# Patient Record
Sex: Female | Born: 1985 | Race: White | Hispanic: No | Marital: Single | Smoking: Never smoker
Health system: Southern US, Community
[De-identification: ages and names within clinical notes are randomized; demographics above are authoritative.]

## PROBLEM LIST (undated history)

## (undated) DIAGNOSIS — I471 Supraventricular tachycardia, unspecified: Secondary | ICD-10-CM

---

## 2020-09-28 ENCOUNTER — Ambulatory Visit
Admission: EM | Admit: 2020-09-28 | Discharge: 2020-09-28 | Disposition: A | Discharge status: Home / Self Care | Payer: 59 | Attending: Physician Assistant | Admitting: Physician Assistant

## 2020-09-28 ENCOUNTER — Other Ambulatory Visit: Payer: Self-pay

## 2020-09-28 ENCOUNTER — Encounter: Payer: Self-pay | Admitting: Emergency Medicine

## 2020-09-28 DIAGNOSIS — R55 Syncope and collapse: Secondary | ICD-10-CM

## 2020-09-28 LAB — POCT FASTING CBG KUC MANUAL ENTRY: POCT Glucose (KUC): 98 mg/dL (ref 70–99)

## 2020-09-28 NOTE — ED Triage Notes (Signed)
States she had an episode on the way to work feeling like she was going to pass out.  States her heart was racing at that time.  States she feels tired and nauseated.  Also c/o right ear pain.

## 2020-09-29 NOTE — ED Provider Notes (Signed)
[facility]    CSN: [id number] Arrival date & time: 09/28/20  1018      History   Chief Complaint No chief complaint on file.   HPI Jacqueline Warner is a 35 y.o. female.   Pt reports she was driving to work and felt like she was going to pass out.  Pt has a history of svt and has had cardiac evaluations for the same in the past.  Pt reports this felt different.  Pt has had a holter monitor  and echo in the past.  The history is provided by the patient. No language interpreter was used.  Weakness Severity:  Moderate Onset quality:  Sudden Timing:  Constant Progression:  Resolved Chronicity:  Recurrent Relieved by:  Nothing Worsened by:  Nothing Ineffective treatments:  None tried Associated symptoms: no chest pain and no shortness of breath   Risk factors: no anemia     History reviewed. No pertinent past medical history.  There are no problems to display for this patient.   History reviewed. No pertinent surgical history.  OB History   No obstetric history on file.      Home Medications    Prior to Admission medications   Not on File    Family History History reviewed. No pertinent family history.  Social History Social History   Tobacco Use  . Smoking status: Never Smoker  . Smokeless tobacco: Never Used  Vaping Use  . Vaping Use: Never used  Substance Use Topics  . Alcohol use: Yes    Comment: occassionally  . Drug use: Never     Allergies   Levaquin [levofloxacin]   Review of Systems Review of Systems  Respiratory: Negative for shortness of breath.   Cardiovascular: Negative for chest pain.  Neurological: Positive for weakness.  All other systems reviewed and are negative.    Physical Exam Triage Vital Signs ED Triage Vitals  Enc Vitals Group     BP 09/28/20 1049 130/76     Pulse Rate 09/28/20 1049 (!) 51     Resp 09/28/20 1049 18     Temp 09/28/20 1049 98.2 F (36.8 C)     Temp Source 09/28/20 1049 Oral     SpO2  09/28/20 1049 98 %     Weight --      Height --      Head Circumference --      Peak Flow --      Pain Score 09/28/20 1051 0     Pain Loc --      Pain Edu? --      Excl. in GC? --    No data found.  Updated Vital Signs BP 130/76 (BP Location: Right Arm)   Pulse (!) 51   Temp 98.2 F (36.8 C) (Oral)   Resp 18   LMP 09/12/2020   SpO2 98%   Visual Acuity Right Eye Distance:   Left Eye Distance:   Bilateral Distance:    Right Eye Near:   Left Eye Near:    Bilateral Near:     Physical Exam Vitals and nursing note reviewed.  Constitutional:      Appearance: She is well-developed.  HENT:     Head: Normocephalic.     Right Ear: Tympanic membrane normal.     Mouth/Throat:     Mouth: Mucous membranes are moist.  Eyes:     Extraocular Movements: Extraocular movements intact.     Pupils: Pupils are equal, round, and reactive to  light.  Cardiovascular:     Rate and Rhythm: Normal rate.  Pulmonary:     Effort: Pulmonary effort is normal.  Abdominal:     General: There is no distension.  Musculoskeletal:        General: Normal range of motion.     Cervical back: Normal range of motion.  Skin:    General: Skin is warm.  Neurological:     General: No focal deficit present.     Mental Status: She is alert and oriented to person, place, and time.  Psychiatric:        Mood and Affect: Mood normal.      UC Treatments / Results  Labs (all labs ordered are listed, but only abnormal results are displayed) Labs Reviewed  POCT FASTING CBG [facility] MANUAL ENTRY    EKG   Radiology No results found.  Procedures Procedures (including critical care time)  Medications Ordered in UC Medications - No data to display  Initial Impression / Assessment and Plan / UC Course  I have reviewed the triage vital signs and the nursing notes.  Pertinent labs & imaging results that were available during my care of the patient were reviewed by me and considered in my medical decision  making (see chart for details).    MDM:  EKG no acute.  Pt in a normal rhythm.  Pt did not eat this am.  Pt drinks energy drinks.    Pt declined going to ED for testing.  I counseled on limitations of Urgent care.  Pt is given referral to cardiology for followup  Final Clinical Impressions(s) / UC Diagnoses   Final diagnoses:  Near syncope   Discharge Instructions   None    ED Prescriptions    None     PDMP not reviewed this encounter.  An After Visit Summary was printed and given to the patient.    Elson Areas, [place] 09/29/20 [phone]

## 2021-02-27 ENCOUNTER — Other Ambulatory Visit: Payer: Self-pay

## 2021-02-27 ENCOUNTER — Ambulatory Visit (INDEPENDENT_AMBULATORY_CARE_PROVIDER_SITE_OTHER): Payer: Self-pay

## 2021-02-27 ENCOUNTER — Ambulatory Visit
Admission: EM | Admit: 2021-02-27 | Discharge: 2021-02-27 | Disposition: A | Discharge status: Home / Self Care | Payer: 59 | Attending: Internal Medicine | Admitting: Internal Medicine

## 2021-02-27 DIAGNOSIS — R197 Diarrhea, unspecified: Secondary | ICD-10-CM

## 2021-02-27 DIAGNOSIS — R1030 Lower abdominal pain, unspecified: Secondary | ICD-10-CM

## 2021-02-27 DIAGNOSIS — A09 Infectious gastroenteritis and colitis, unspecified: Secondary | ICD-10-CM

## 2021-02-27 LAB — POCT URINE PREGNANCY: Preg Test, Ur: NEGATIVE

## 2021-02-27 MED ORDER — AMOXICILLIN-POT CLAVULANATE 875-125 MG PO TABS: 1.0000 | ORAL_TABLET | Freq: Two times a day (BID) | ORAL | 0 refills | Status: AC

## 2021-02-27 MED ORDER — POLYETHYLENE GLYCOL 3350 17 G PO PACK: 17.0000 g | PACK | Freq: Every day | ORAL | 0 refills | Status: AC | PRN

## 2021-02-27 NOTE — ED Provider Notes (Signed)
[facility]    CSN: [id number] Arrival date & time: 02/27/21  0846      History   Chief Complaint Chief Complaint  Patient presents with   Abdominal Pain   Nausea    HPI Jacqueline Warner is a 35 y.o. female comes to the urgent care with 4-day history of lower abdominal pain, nausea and bloating sensation.  Patient initially felt constipated and has tried over-the-counter agents with no improvement in the bloating sensation.  She had some diarrheal bowel movements.  She still feels constipated and bloated.  No febrile episodes.  No generalized body aches.  No fever or chills.  He denies any vomiting.Marland Kitchen   HPI  History reviewed. No pertinent past medical history.  There are no problems to display for this patient.   History reviewed. No pertinent surgical history.  OB History   No obstetric history on file.      Home Medications    Prior to Admission medications   Medication Sig Start Date End Date Taking? Authorizing Provider  amoxicillin-clavulanate (AUGMENTIN) 875-125 MG tablet Take 1 tablet by mouth every 12 (twelve) hours. 02/27/21  Yes Thiago Ragsdale, Britta Mccreedy, MD  polyethylene glycol (MIRALAX) 17 g packet Take 17 g by mouth daily as needed. 02/27/21  Yes Cayman Kielbasa, Britta Mccreedy, MD    Family History History reviewed. No pertinent family history.  Social History Social History   Tobacco Use   Smoking status: Never   Smokeless tobacco: Never  Vaping Use   Vaping Use: Never used  Substance Use Topics   Alcohol use: Yes    Comment: occassionally   Drug use: Never     Allergies   Levaquin [levofloxacin]   Review of Systems Review of Systems  Constitutional: Negative.   Gastrointestinal:  Positive for abdominal pain, constipation, diarrhea and nausea. Negative for vomiting.  Musculoskeletal: Negative.   Skin: Negative.     Physical Exam Triage Vital Signs ED Triage Vitals  Enc Vitals Group     BP 02/27/21 1001 122/77     Pulse Rate 02/27/21 1001 62      Resp 02/27/21 1001 18     Temp 02/27/21 1001 98.1 F (36.7 C)     Temp Source 02/27/21 1001 Oral     SpO2 02/27/21 1001 99 %     Weight --      Height --      Head Circumference --      Peak Flow --      Pain Score 02/27/21 1003 4     Pain Loc --      Pain Edu? --      Excl. in GC? --    No data found.  Updated Vital Signs BP 122/77 (BP Location: Right Arm)   Pulse 62   Temp 98.1 F (36.7 C) (Oral)   Resp 18   LMP 02/02/2021   SpO2 99%   Visual Acuity Right Eye Distance:   Left Eye Distance:   Bilateral Distance:    Right Eye Near:   Left Eye Near:    Bilateral Near:     Physical Exam Vitals and nursing note reviewed.  Constitutional:      General: She is not in acute distress.    Appearance: She is not ill-appearing.  Cardiovascular:     Rate and Rhythm: Normal rate and regular rhythm.  Pulmonary:     Effort: Pulmonary effort is normal.     Breath sounds: Normal breath sounds.  Abdominal:  Palpations: Abdomen is soft. There is no shifting dullness or splenomegaly.     Tenderness: There is no abdominal tenderness. There is no guarding or rebound.  Neurological:     Mental Status: She is alert.     UC Treatments / Results  Labs (all labs ordered are listed, but only abnormal results are displayed) Labs Reviewed  CBC WITH DIFFERENTIAL/PLATELET  POCT URINE PREGNANCY    EKG   Radiology DG Abd 1 View  Result Date: 02/27/2021 CLINICAL DATA:  Lower abdominal pain and nausea for 4 days. Diarrhea. EXAM: ABDOMEN - 1 VIEW COMPARISON:  None. FINDINGS: The bowel gas pattern is normal. No radio-opaque calculi or other significant radiographic abnormality are seen. IMPRESSION: Negative. Electronically Signed   By: Danae Orleans M.D.   On: 02/27/2021 11:20    Procedures Procedures (including critical care time)  Medications Ordered in UC Medications - No data to display  Initial Impression / Assessment and Plan / UC Course  I have reviewed the triage  vital signs and the nursing notes.  Pertinent labs & imaging results that were available during my care of the patient were reviewed by me and considered in my medical decision making (see chart for details).     1.  Colitis: KUB is negative for bowel distention or obstruction Augmentin 875-125 mg twice daily for 7 days MiraLAX as needed for constipation Return precautions given CBC. Final Clinical Impressions(s) / UC Diagnoses   Final diagnoses:  Infectious colitis     Discharge Instructions      Increase oral fluid intake Please take medications as prescribed We will call you with recommendations if labs are abnormal Return to urgent care if symptoms are persistent or worsens.   ED Prescriptions     Medication Sig Dispense Auth. Provider   amoxicillin-clavulanate (AUGMENTIN) 875-125 MG tablet Take 1 tablet by mouth every 12 (twelve) hours. 14 tablet Nea Gittens, Britta Mccreedy, MD   polyethylene glycol (MIRALAX) 17 g packet Take 17 g by mouth daily as needed. 14 each Jeanet Lupe, Britta Mccreedy, MD      PDMP not reviewed this encounter.   Merrilee Jansky, MD 02/27/21 [phone]

## 2021-02-27 NOTE — Discharge Instructions (Addendum)
Increase oral fluid intake Please take medications as prescribed We will call you with recommendations if labs are abnormal Return to urgent care if symptoms are persistent or worsens.

## 2021-02-27 NOTE — ED Triage Notes (Signed)
Pt present lower abdominal pain with nausea. Symptom started four days ago. Pt has been having some diarrhea that is watery and gas on her stomach. Pt tried otc medication to help with some relief but then she becomes constipated again

## 2021-02-28 LAB — CBC WITH DIFFERENTIAL/PLATELET
Basophils Absolute: 0 10*3/uL (ref 0.0–0.2)
Basos: 1 %
EOS (ABSOLUTE): 0.1 10*3/uL (ref 0.0–0.4)
Eos: 1 %
Hematocrit: 40.1 % (ref 34.0–46.6)
Hemoglobin: 13.3 g/dL (ref 11.1–15.9)
Immature Grans (Abs): 0 10*3/uL (ref 0.0–0.1)
Immature Granulocytes: 0 %
Lymphocytes Absolute: 1.5 10*3/uL (ref 0.7–3.1)
Lymphs: 31 %
MCH: 30.3 pg (ref 26.6–33.0)
MCHC: 33.2 g/dL (ref 31.5–35.7)
MCV: 91 fL (ref 79–97)
Monocytes Absolute: 0.4 10*3/uL (ref 0.1–0.9)
Monocytes: 9 %
Neutrophils Absolute: 2.7 10*3/uL (ref 1.4–7.0)
Neutrophils: 58 %
Platelets: 158 10*3/uL (ref 150–450)
RBC: 4.39 x10E6/uL (ref 3.77–5.28)
RDW: 12.9 % (ref 11.7–15.4)
WBC: 4.7 10*3/uL (ref 3.4–10.8)

## 2021-11-16 IMAGING — DX DG ABDOMEN 1V
1 series · 1 of 1 positions shown · non-contrast
Comparison: None.

CLINICAL DATA: Lower abdominal pain and nausea for 4 days.
Diarrhea.

EXAM:
ABDOMEN - 1 VIEW

[abdomen supine ap]
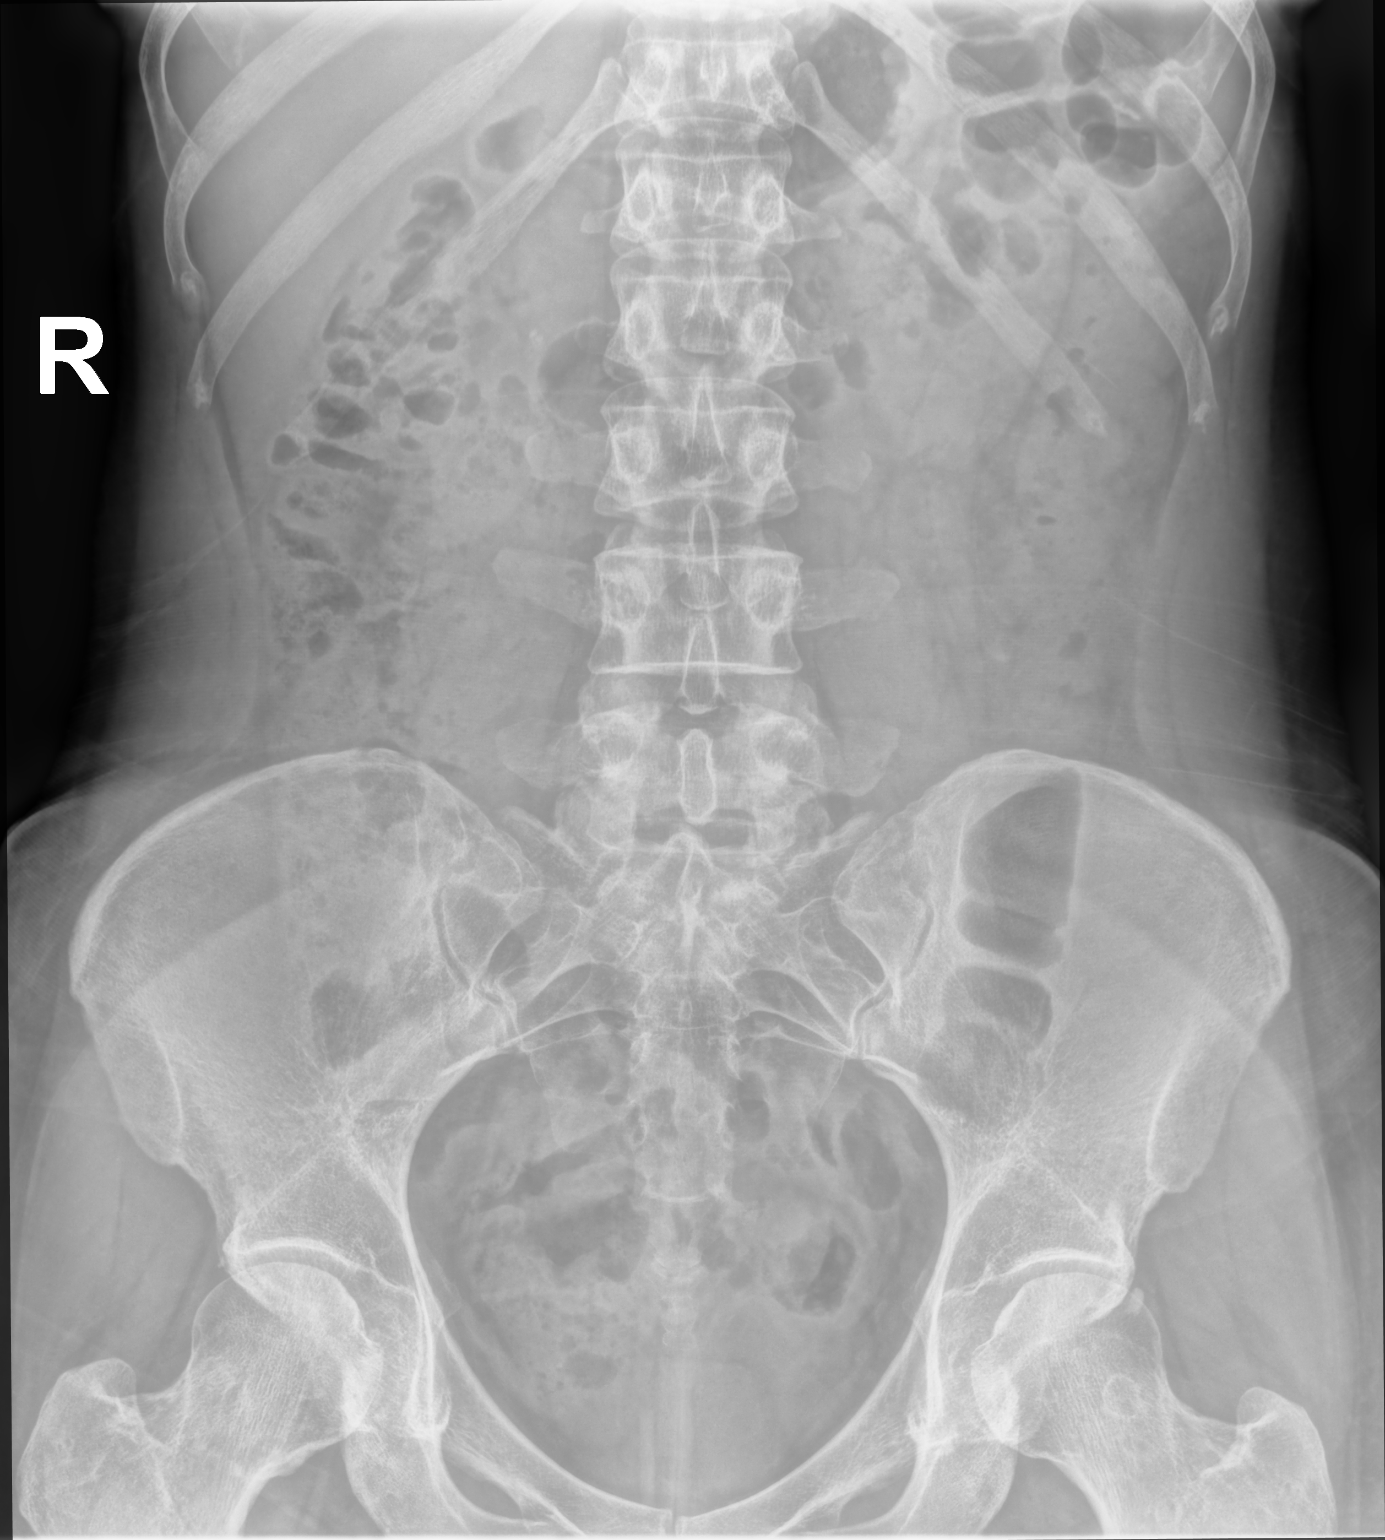

[1 of 1 positions shown; findings below may reference images not displayed]

FINDINGS: The bowel gas pattern is normal. No radio-opaque calculi or other
significant radiographic abnormality are seen.
IMPRESSION: Negative.

## 2021-12-23 ENCOUNTER — Emergency Department (HOSPITAL_COMMUNITY)
Admission: EM | Admit: 2021-12-23 | Discharge: 2021-12-24 | Disposition: A | Discharge status: Home / Self Care | Payer: 59 | Attending: Emergency Medicine | Admitting: Emergency Medicine

## 2021-12-23 ENCOUNTER — Emergency Department (HOSPITAL_COMMUNITY): Payer: 59

## 2021-12-23 ENCOUNTER — Encounter (HOSPITAL_COMMUNITY): Payer: Self-pay | Admitting: *Deleted

## 2021-12-23 ENCOUNTER — Other Ambulatory Visit: Payer: Self-pay

## 2021-12-23 DIAGNOSIS — N83201 Unspecified ovarian cyst, right side: Secondary | ICD-10-CM | POA: Insufficient documentation

## 2021-12-23 DIAGNOSIS — N83299 Other ovarian cyst, unspecified side: Secondary | ICD-10-CM

## 2021-12-23 DIAGNOSIS — R109 Unspecified abdominal pain: Secondary | ICD-10-CM

## 2021-12-23 DIAGNOSIS — R1031 Right lower quadrant pain: Secondary | ICD-10-CM | POA: Diagnosis present

## 2021-12-23 DIAGNOSIS — N83209 Unspecified ovarian cyst, unspecified side: Secondary | ICD-10-CM | POA: Insufficient documentation

## 2021-12-23 HISTORY — DX: Supraventricular tachycardia: I47.1

## 2021-12-23 HISTORY — DX: Supraventricular tachycardia, unspecified: I47.10

## 2021-12-23 LAB — COMPREHENSIVE METABOLIC PANEL
ALT: 20 U/L (ref 0–44)
AST: 19 U/L (ref 15–41)
Albumin: 4.2 g/dL (ref 3.5–5.0)
Alkaline Phosphatase: 49 U/L (ref 38–126)
Anion gap: 6 (ref 5–15)
BUN: 14 mg/dL (ref 6–20)
CO2: 24 mmol/L (ref 22–32)
Calcium: 9.8 mg/dL (ref 8.9–10.3)
Chloride: 106 mmol/L (ref 98–111)
Creatinine, Ser: 0.71 mg/dL (ref 0.44–1.00)
GFR, Estimated: >60 mL/min (ref >60)
Glucose, Bld: 100 mg/dL — ABNORMAL HIGH (ref 70–99)
Potassium: 4.4 mmol/L (ref 3.5–5.1)
Sodium: 136 mmol/L (ref 135–145)
Total Bilirubin: 0.5 mg/dL (ref 0.3–1.2)
Total Protein: 7 g/dL (ref 6.5–8.1)

## 2021-12-23 LAB — CBC WITH DIFFERENTIAL/PLATELET
Abs Immature Granulocytes: 0.01 10*3/uL (ref 0.00–0.07)
Basophils Absolute: 0 10*3/uL (ref 0.0–0.1)
Basophils Relative: 0 %
Eosinophils Absolute: 0.1 10*3/uL (ref 0.0–0.5)
Eosinophils Relative: 1 %
HCT: 39.2 % (ref 36.0–46.0)
Hemoglobin: 13.2 g/dL (ref 12.0–15.0)
Immature Granulocytes: 0 %
Lymphocytes Relative: 25 %
Lymphs Abs: 1.7 10*3/uL (ref 0.7–4.0)
MCH: 30.1 pg (ref 26.0–34.0)
MCHC: 33.7 g/dL (ref 30.0–36.0)
MCV: 89.5 fL (ref 80.0–100.0)
Monocytes Absolute: 0.5 10*3/uL (ref 0.1–1.0)
Monocytes Relative: 8 %
Neutro Abs: 4.4 10*3/uL (ref 1.7–7.7)
Neutrophils Relative %: 66 %
Platelets: 173 10*3/uL (ref 150–400)
RBC: 4.38 MIL/uL (ref 3.87–5.11)
RDW: 12.7 % (ref 11.5–15.5)
WBC: 6.8 10*3/uL (ref 4.0–10.5)
nRBC: 0 % (ref 0.0–0.2)

## 2021-12-23 LAB — URINALYSIS, ROUTINE W REFLEX MICROSCOPIC
Bilirubin Urine: NEGATIVE
Glucose, UA: NEGATIVE mg/dL
Hgb urine dipstick: NEGATIVE
Ketones, ur: 20 mg/dL — AB
Leukocytes,Ua: NEGATIVE
Nitrite: NEGATIVE
Protein, ur: NEGATIVE mg/dL
Specific Gravity, Urine: 1.021 (ref 1.005–1.030)
pH: 7 (ref 5.0–8.0)

## 2021-12-23 LAB — LIPASE, BLOOD: Lipase: 36 U/L (ref 11–51)

## 2021-12-23 LAB — PREGNANCY, URINE: Preg Test, Ur: NEGATIVE

## 2021-12-23 MED ORDER — MORPHINE SULFATE (PF) 4 MG/ML IV SOLN
2.0000 mg | Freq: Once | INTRAVENOUS | Status: AC
  Administered 2021-12-24: 2 mg via INTRAVENOUS
  Filled 2021-12-23: qty 1

## 2021-12-23 MED ORDER — IOHEXOL 300 MG/ML  SOLN
100.0000 mL | Freq: Once | INTRAMUSCULAR | Status: AC | PRN
  Administered 2021-12-23: 100 mL via INTRAVENOUS

## 2021-12-23 MED ORDER — OXYCODONE HCL 5 MG PO TABS: 5.0000 mg | ORAL_TABLET | Freq: Four times a day (QID) | ORAL | 0 refills | Status: AC | PRN

## 2021-12-23 MED ORDER — IBUPROFEN 800 MG PO TABS: 800.0000 mg | ORAL_TABLET | Freq: Three times a day (TID) | ORAL | 0 refills | Status: AC

## 2021-12-23 MED ORDER — KETOROLAC TROMETHAMINE 30 MG/ML IJ SOLN
30.0000 mg | Freq: Once | INTRAMUSCULAR | Status: AC
Start: 2021-12-23 — End: 2021-12-23
  Administered 2021-12-23: 30 mg via INTRAVENOUS
  Filled 2021-12-23: qty 1

## 2021-12-23 MED ORDER — MORPHINE SULFATE (PF) 4 MG/ML IV SOLN
4.0000 mg | Freq: Once | INTRAVENOUS | Status: AC
  Administered 2021-12-23: 4 mg via INTRAVENOUS
  Filled 2021-12-23: qty 1

## 2021-12-23 NOTE — ED Provider Notes (Signed)
[facility] EMERGENCY DEPARTMENT Provider Note   CSN: [id number] Arrival date & time: 12/23/21  2113     History  Chief Complaint  Patient presents with   Abdominal Pain    Jacqueline Warner is a 36 y.o. female presenting to the abdominal pain.  She reports onset earlier this evening with initially left lower side abdominal pressure, had a bowel movement with relief, but the pain is now migrated to the right lower side and is persistent.  It is moderate intensity.  She has not had the symptoms before.  She reports she does have a history of an ovarian cyst, is not currently on her menstrual cycle.  She denies history of abdominal surgeries.  She denies nausea or vomiting.  HPI     Home Medications Prior to Admission medications   Medication Sig Start Date End Date Taking? Authorizing Provider  ibuprofen (ADVIL) 800 MG tablet Take 1 tablet (800 mg total) by mouth 3 (three) times daily for 30 doses. 12/23/21 01/02/22 Yes Tenzin Edelman, Kermit Balo, MD  oxyCODONE (ROXICODONE) 5 MG immediate release tablet Take 1 tablet (5 mg total) by mouth every 6 (six) hours as needed for up to 15 doses for severe pain. 12/23/21  Yes Terald Sleeper, MD  amoxicillin-clavulanate (AUGMENTIN) 875-125 MG tablet Take 1 tablet by mouth every 12 (twelve) hours. 02/27/21   Merrilee Jansky, MD  polyethylene glycol (MIRALAX) 17 g packet Take 17 g by mouth daily as needed. 02/27/21   LampteyBritta Mccreedy, MD      Allergies    Levaquin [levofloxacin]    Review of Systems   Review of Systems  Physical Exam Updated Vital Signs BP 107/71   Pulse (!) 52   Temp 98.2 F (36.8 C) (Oral)   Resp 17   Ht 5' 3.5" (1.613 m)   Wt 70.3 kg   LMP 12/05/2021   SpO2 97%   BMI 27.03 kg/m  Physical Exam Constitutional:      General: She is not in acute distress. HENT:     Head: Normocephalic and atraumatic.  Eyes:     Conjunctiva/sclera: Conjunctivae normal.     Pupils: Pupils are equal, round, and reactive to light.   Cardiovascular:     Rate and Rhythm: Normal rate and regular rhythm.  Pulmonary:     Effort: Pulmonary effort is normal. No respiratory distress.  Abdominal:     General: There is no distension.     Tenderness: There is abdominal tenderness in the right lower quadrant and suprapubic area. There is guarding. Positive signs include McBurney's sign. Negative signs include Murphy's sign.  Skin:    General: Skin is warm and dry.  Neurological:     General: No focal deficit present.     Mental Status: She is alert. Mental status is at baseline.  Psychiatric:        Mood and Affect: Mood normal.        Behavior: Behavior normal.     ED Results / Procedures / Treatments   Labs (all labs ordered are listed, but only abnormal results are displayed) Labs Reviewed  COMPREHENSIVE METABOLIC PANEL - Abnormal; Notable for the following components:      Result Value   Glucose, Bld 100 (*)    All other components within normal limits  URINALYSIS, ROUTINE W REFLEX MICROSCOPIC - Abnormal; Notable for the following components:   APPearance HAZY (*)    Ketones, ur 20 (*)    All other components within normal  limits  CBC WITH DIFFERENTIAL/PLATELET  LIPASE, BLOOD  PREGNANCY, URINE    EKG None  Radiology US PELVIS TRANSVAGINAL NON-OB (TV ONLY)  Result Date: 12/24/2021 CLINICAL DATA:  RIGHT ovarian cyst seen on CT. EXAM: TRANSABDOMINAL AND TRANSVAGINAL ULTRASOUND OF PELVIS TECHNIQUE: Both transabdominal and transvaginal ultrasound examinations of the pelvis were performed. Transabdominal technique was performed for global imaging of the pelvis including uterus, ovaries, adnexal regions, and pelvic cul-de-sac. It was necessary to proceed with endovaginal exam following the transabdominal exam to visualize the endometrium and RIGHT and LEFT adnexa. COMPARISON:  CT evaluation which was acquired on December 23, 2021. FINDINGS: Uterus Measurements: 5.9 x 3.9 x 5.2 cm = volume: 62 mL. Uterus displays in  arcuate type configuration. No focal uterine abnormality. Endometrium Thickness: 12.7 mm.  No focal abnormality visualized. Right ovary Measurements: 5.2 x 3.4 x 4.0 cm = volume: 36.9 mL. Multi cystic area or single cysts with septation within the RIGHT ovary, echogenic area next to a more hypoechoic or anechoic area both with "cranial ated" appearance. The area that is more echogenic measuring 3.0 x 1.6 cm the adjacent area is slightly smaller measuring 2.4 x 1.5 cm in the sagittal plane with transverse dimension each area proximally 3 and 2.3 cm. Left ovary Measurements: 4.1 x 1.5 x 2.8 cm = volume: 9.2 mL. Small hemorrhagic cyst in the LEFT ovary measuring 1.7 x 1.3 x 1.7 cm Other findings Trace free fluid in the pelvis IMPRESSION: Cystic changes in the RIGHT ovary favored to represent an involuting hemorrhagic and follicular cyst of the RIGHT ovary adjacent to 1 another perhaps 1 representing hemorrhagic corpus luteum. Given that this may not represent an incidental finding based on patient history would consider short interval follow-up in 8-12 weeks to ensure resolution or earlier based on patient's symptoms. Second cystic area in the RIGHT adnexa referenced on previous imaging is not evaluated. Study not performed as a spectral Doppler assessment but there is gross evidence of color Doppler flow. If there is concern for torsion based on patient history could consider additional evaluation as warranted. Electronically Signed   By: Donzetta Kohut M.D.   On: 12/24/2021 12:20   CT ABDOMEN PELVIS W CONTRAST  Result Date: 12/23/2021 CLINICAL DATA:  Right lower quadrant abdominal pain. EXAM: CT ABDOMEN AND PELVIS WITH CONTRAST TECHNIQUE: Multidetector CT imaging of the abdomen and pelvis was performed using the standard protocol following bolus administration of intravenous contrast. RADIATION DOSE REDUCTION: This exam was performed according to the departmental dose-optimization program which includes automated  exposure control, adjustment of the mA and/or kV according to patient size and/or use of iterative reconstruction technique. CONTRAST:  OMNIPAQUE IOHEXOL 300 MG/ML  SOLN COMPARISON:  None Available. FINDINGS: Lower chest: The visualized lung bases are clear. No intra-abdominal free air.  Small free fluid within the pelvis. Hepatobiliary: No focal liver abnormality is seen. No gallstones, gallbladder wall thickening, or biliary dilatation. Pancreas: Unremarkable. No pancreatic ductal dilatation or surrounding inflammatory changes. Spleen: Normal in size without focal abnormality. Adrenals/Urinary Tract: The adrenal glands are unremarkable right renal cortical scarring. There is no hydronephrosis on either side. There is symmetric enhancement of the kidneys. The visualized ureters and urinary bladder appear unremarkable. Stomach/Bowel: There is no bowel obstruction or active inflammation. The appendix is not identified. The cecum abuts the right adnexa. There is a 1.2 x 1.2 cm loculated collection or cystic structure in the region of the right adnexa adjacent to posteromedial cecum. Vascular/Lymphatic: The abdominal aorta and IVC  are unremarkable. No portal venous gas. There is no adenopathy. Reproductive: The uterus is anteverted. There is a by coronoid or arcuate uterine morphology. Complex multi cystic or multi-septated right ovarian lesion measuring 4.2 x 3.1 cm. Further evaluation with pelvic ultrasound recommended. Other: None Musculoskeletal: No acute or significant osseous findings. IMPRESSION: 1. Complex multi cystic or multi-septated right ovarian lesion. Further evaluation with pelvic ultrasound recommended. 2. Nonvisualization of the appendix. A small fluid collection in the region of the adnexa adjacent to the posterior cecum is indeterminate. Attention on pelvic ultrasound recommended. 3. No bowel obstruction. Electronically Signed   By: Elgie Collard M.D.   On: 12/23/2021 23:18     Procedures Procedures    Medications Ordered in ED Medications  morphine (PF) 4 MG/ML injection 4 mg (4 mg Intravenous Given 12/23/21 2215)  ketorolac (TORADOL) 30 MG/ML injection 30 mg (30 mg Intravenous Given 12/23/21 2215)  iohexol (OMNIPAQUE) 300 MG/ML solution 100 mL (100 mLs Intravenous Contrast Given 12/23/21 2256)  morphine (PF) 4 MG/ML injection 2 mg (2 mg Intravenous Given 12/24/21 0014)    ED Course/ Medical Decision Making/ A&P Clinical Course as of 12/24/21 1445  Thu Dec 23, 2021  2348 Reassessed the patient her pain is overall improved.  We discussed the CT findings, with a complex ovarian cyst.  I suspect likely cause of her symptoms.  She does not appear clinically to be having issues with ovarian torsion at this point.  We could not visualize the appendix on the CT scan, but with no leukocytosis, no persistent GI symptoms or nausea, and no periappendiceal inflammation, this seems less likely the case.  We discussed the options of observation stay in the hospital versus close follow-up, I do believe she needs a pelvic ultrasound, which is not available at this hour, but she will likely return in the morning for pelvic ultrasonography for her complex cyst.  She may need OB follow-up.  She does not have an OB/GYN doctor here in town, since she moved from [place]. [MT]    Clinical Course User Index [MT] Rosaura Bolon, Kermit Balo, MD                           Medical Decision Making Amount and/or Complexity of Data Reviewed Labs: ordered. Radiology: ordered.  Risk Prescription drug management.   This patient presents to the Emergency Department with complaint of abdominal pain. This involves an extensive number of treatment options, and is a complaint that carries with it a high risk of complications and morbidity.  The differential diagnosis includes, but is not limited to, appendicitis versus ruptured ovarian cyst versus ureteral colic versus UTI versus colitis versus other  I  ordered, reviewed, and interpreted labs, notable for no leukocytosis, Cr at baseline, no evidence of infection on UA I ordered medication IV morphine, IV Toradol for abdominal pain and/or nausea I ordered imaging studies which included CT abdomen pelvis  I independently visualized and interpreted imaging which showed complex right peri-ovarian cystic structure, appendix not fully visualized, and the monitor tracing which showed NSR  After the interventions stated above, I reevaluated the patient and found that they remained clinically stable, with improvement of her pain.  Given that her pain has improved, my suspicion for ovarian torsion is lower, but we discussed this on the differential.  She will return tomorrow morning for pelvic ultrasound with doppler imaging and needs OB follow up.  If pain is worsening she needs to return  to the ED.  Clinical Course as of 12/24/21 1445  Thu Dec 23, 2021  2348 Reassessed the patient her pain is overall improved.  We discussed the CT findings, with a complex ovarian cyst.  I suspect likely cause of her symptoms.  She does not appear clinically to be having issues with ovarian torsion at this point.  We could not visualize the appendix on the CT scan, but with no leukocytosis, no persistent GI symptoms or nausea, and no periappendiceal inflammation, this seems less likely the case.  We discussed the options of observation stay in the hospital versus close follow-up, I do believe she needs a pelvic ultrasound, which is not available at this hour, but she will likely return in the morning for pelvic ultrasonography for her complex cyst.  She may need OB follow-up.  She does not have an OB/GYN doctor here in town, since she moved from [place]. [MT]    Clinical Course User Index [MT] Jolea Dolle, Kermit Balo, MD            Final Clinical Impression(s) / ED Diagnoses Final diagnoses:  Abdominal pain, unspecified abdominal location  Complex ovarian cyst    Rx  / DC Orders ED Discharge Orders          Ordered    US Transvaginal Non-OB (US Pelvis)  Status:  Canceled        12/23/21 2350    oxyCODONE (ROXICODONE) 5 MG immediate release tablet  Every 6 hours PRN        12/23/21 2353    ibuprofen (ADVIL) 800 MG tablet  3 times daily        12/23/21 2353    US PELVIS TRANSVAGINAL NON-OB (TV ONLY)        12/23/21 2350              Terald Sleeper, MD 12/24/21 1447

## 2021-12-23 NOTE — ED Notes (Signed)
Patient transported to CT 

## 2021-12-23 NOTE — ED Triage Notes (Signed)
Pt with RLQ pain that started around 1830, states pain is getting worse.  LBM earlier-normal per pt.  Denies N/V/D

## 2021-12-23 NOTE — Discharge Instructions (Addendum)
Your CT scan showed a have a large, complex cyst on your right ovary, which is likely the cause of your abdominal pain.  This needs closer attention, including an ultrasound image, as well as follow-up with an OB/GYN doctor.  In the meantime you can take ibuprofen as needed at home for pain.  If your pain continues to worsen, or if you have persistent vomiting, began having fevers, please come back to the ER immediately.  We talked about returning in the morning for an ultrasound of your ovaries.  Please see the instructions below.  *  IMPORTANT PATIENT INSTRUCTIONS:  Your ED provider has recommended an Outpatient Ultrasound.  Please call [phone] tomorrow morning to schedule an appointment.  If your appointment is scheduled for a Saturday, Sunday or holiday, please go to the [facility] Emergency Department Registration Desk at least 15 minutes prior to your appointment time and tell them you are there for an ultrasound.    If your appointment is scheduled for a weekday (Monday-Friday), please go directly to the [facility] Radiology Department at least 15 minutes prior to your appointment time and tell them you are there for an ultrasound.  Please call [phone] with questions.

## 2021-12-24 ENCOUNTER — Emergency Department (HOSPITAL_COMMUNITY)
Admission: RE | Admit: 2021-12-24 | Discharge: 2021-12-24 | Disposition: A | Discharge status: Home / Self Care | Payer: 59 | Source: Ambulatory Visit | Attending: Emergency Medicine | Admitting: Emergency Medicine

## 2021-12-24 DIAGNOSIS — N83201 Unspecified ovarian cyst, right side: Secondary | ICD-10-CM

## 2022-09-11 IMAGING — CT CT ABD-PELV W/ CM
2 of 4 series · 16 of 46 positions shown, 18 images · IV contrast (Omnipaque or Isovue)
Comparison: None Available.

CLINICAL DATA: Right lower quadrant abdominal pain.

EXAM:
CT ABDOMEN AND PELVIS WITH CONTRAST
TECHNIQUE: Multidetector CT imaging of the abdomen and pelvis was performed
using the standard protocol following bolus administration of
intravenous contrast.

[Series 2: axial st · axial · 0.60mm/px · z∈[+1026,+1426]mm · 13 of 90 slices shown, 15 images]
[im 5/90  soft-tissue]
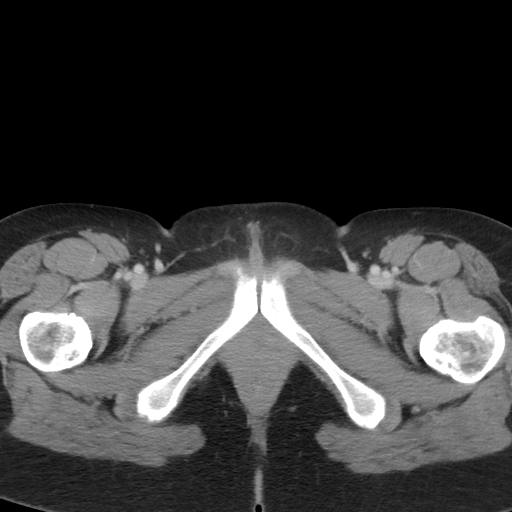
[im 5/90  bone]
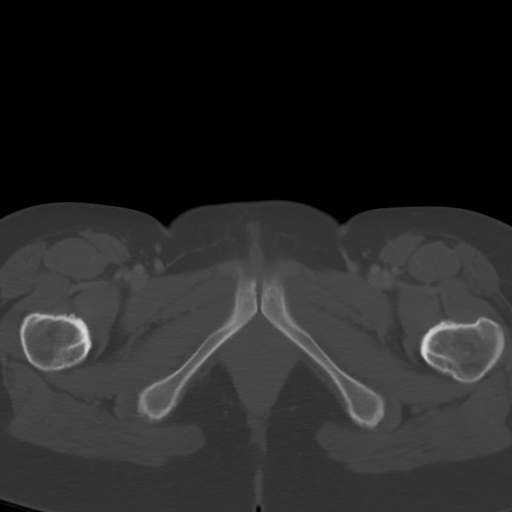
[im 13/90  soft-tissue]
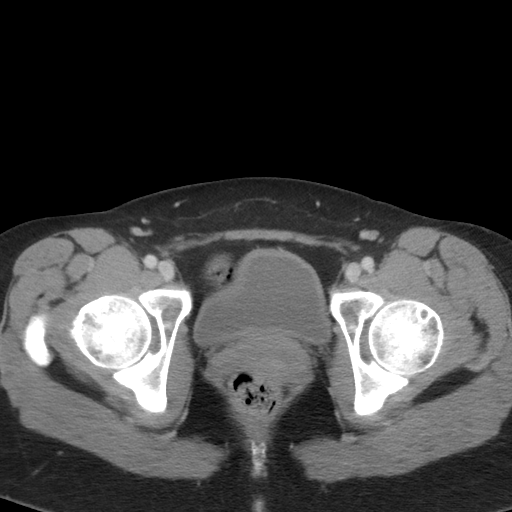
[im 21/90  soft-tissue]
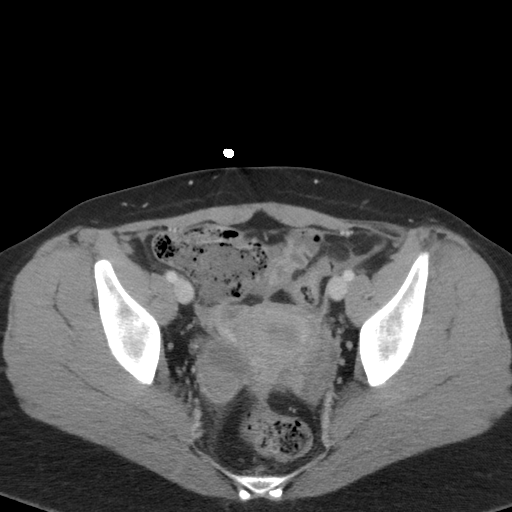
[im 25/90  soft-tissue]
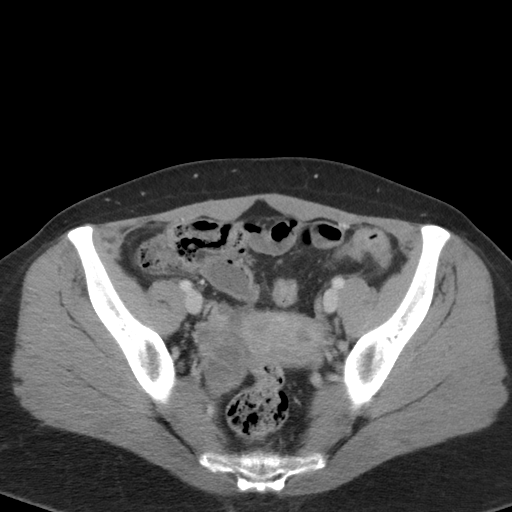
[im 33/90  soft-tissue]
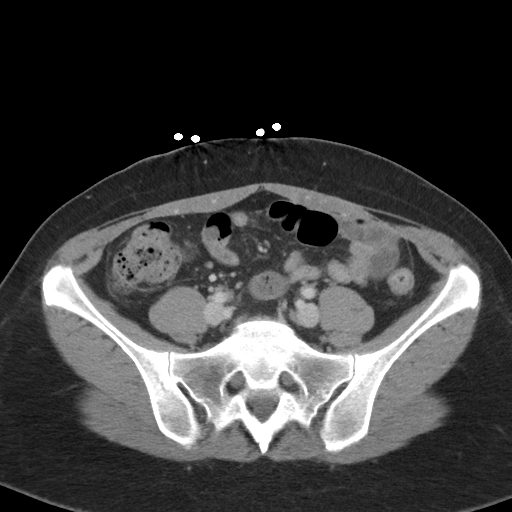
[im 37/90  soft-tissue]
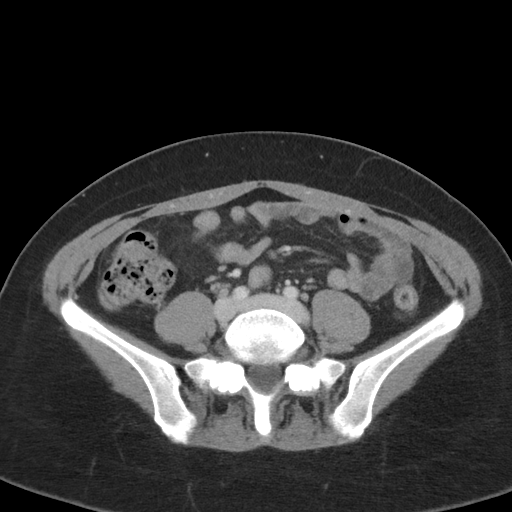
[im 45/90  soft-tissue]
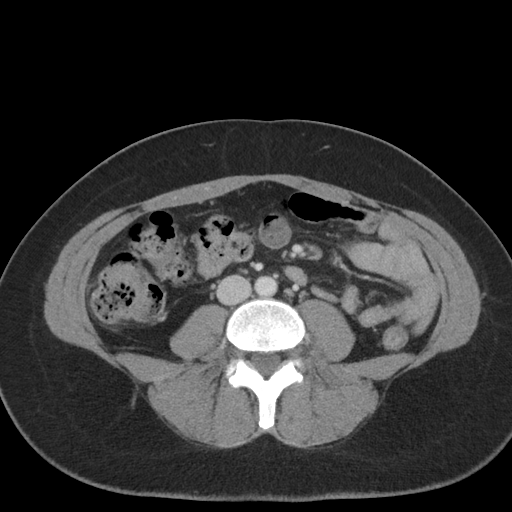
[im 53/90  soft-tissue]
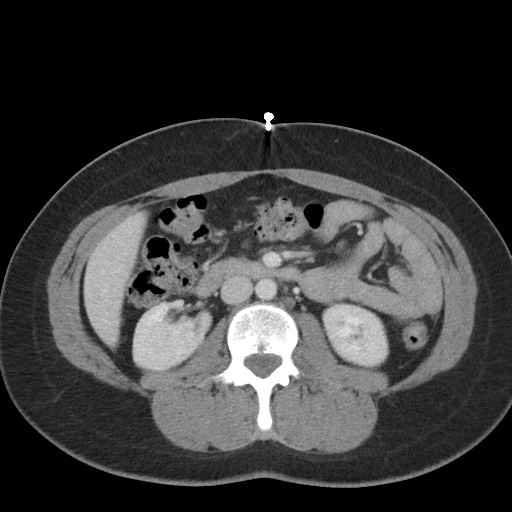
[im 57/90  soft-tissue]
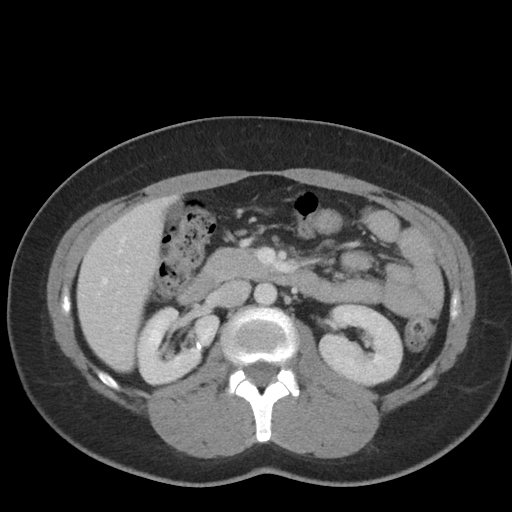
[im 57/90  bone]
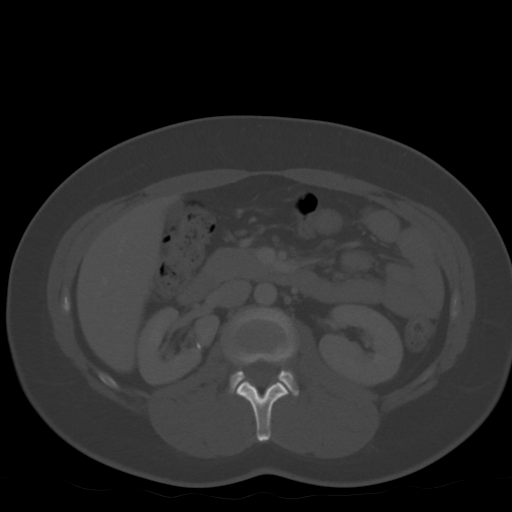
[im 65/90  soft-tissue]
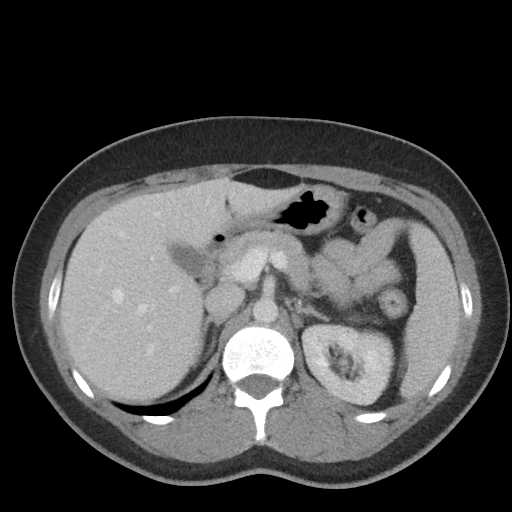
[im 69/90  soft-tissue]
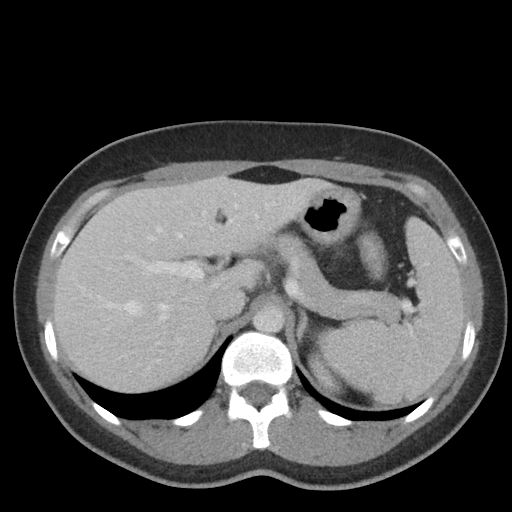
[im 77/90  soft-tissue]
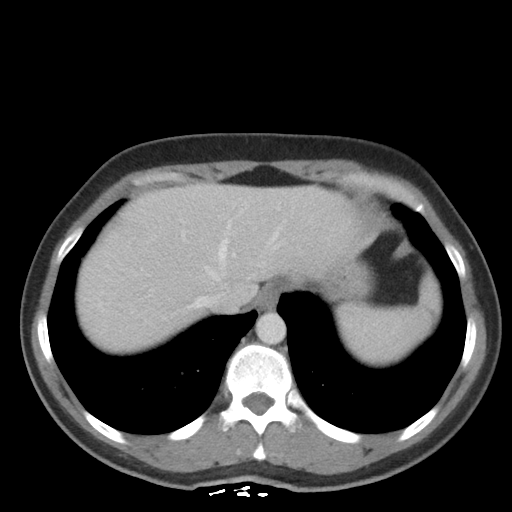
[im 85/90  soft-tissue]
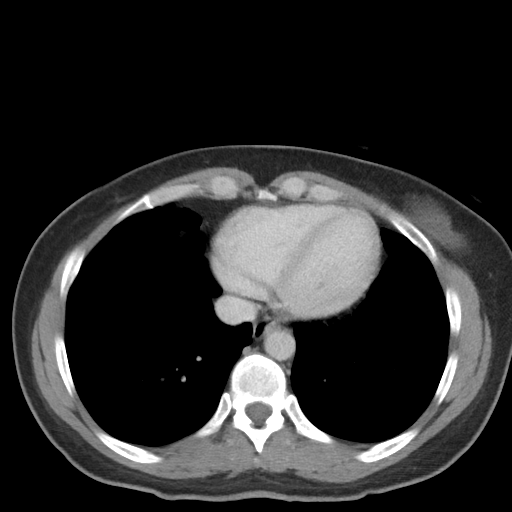

[Series 5: coronal st · coronal · 0.78mm/px · 3 of 78 slices shown]
[im 26/78  soft-tissue]
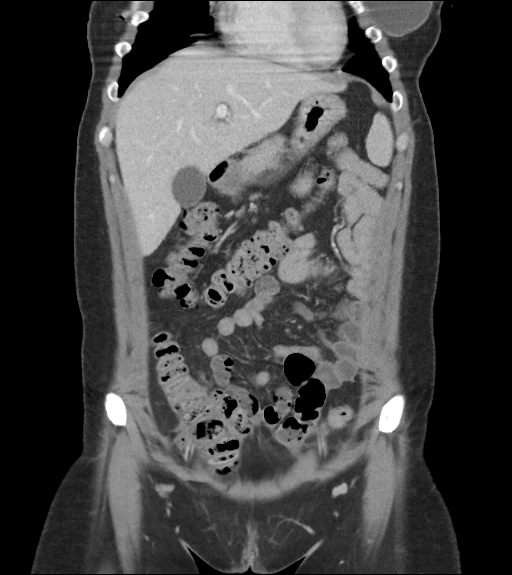
[im 35/78  soft-tissue]
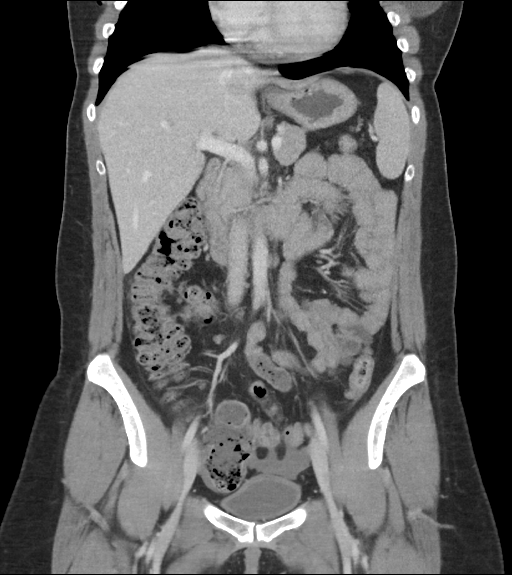
[im 43/78  soft-tissue]
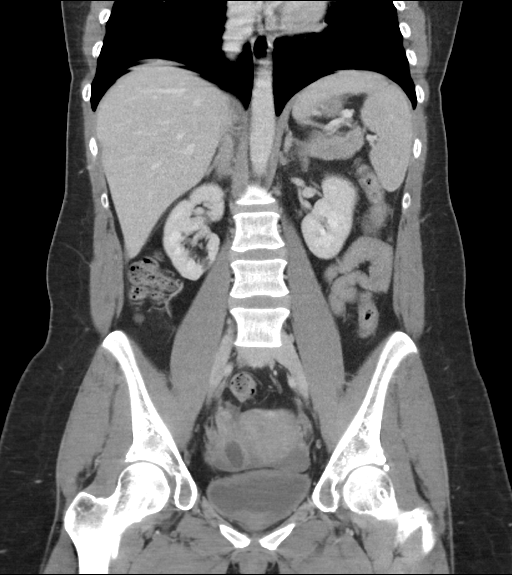

[16 of 46 positions shown; findings below may reference images not displayed]

RADIATION DOSE REDUCTION: This exam was performed according to the
departmental dose-optimization program which includes automated
exposure control, adjustment of the mA and/or kV according to
patient size and/or use of iterative reconstruction technique.

CONTRAST:  100mL OMNIPAQUE IOHEXOL 300 MG/ML  SOLN
FINDINGS: Lower chest: The visualized lung bases are clear.

No intra-abdominal free air.  Small free fluid within the pelvis.

Hepatobiliary: No focal liver abnormality is seen. No gallstones,
gallbladder wall thickening, or biliary dilatation.

Pancreas: Unremarkable. No pancreatic ductal dilatation or
surrounding inflammatory changes.

Spleen: Normal in size without focal abnormality.

Adrenals/Urinary Tract: The adrenal glands are unremarkable right
renal cortical scarring. There is no hydronephrosis on either side.
There is symmetric enhancement of the kidneys. The visualized
ureters and urinary bladder appear unremarkable.

Stomach/Bowel: There is no bowel obstruction or active inflammation.
The appendix is not identified. The cecum abuts the right adnexa.
There is a 1.2 x 1.2 cm loculated collection or cystic structure in
the region of the right adnexa adjacent to posteromedial cecum.

Vascular/Lymphatic: The abdominal aorta and IVC are unremarkable. No
portal venous gas. There is no adenopathy.

Reproductive: The uterus is anteverted. There is a by coronoid or
arcuate uterine morphology. Complex multi cystic or multi-septated
right ovarian lesion measuring 4.2 x 3.1 cm. Further evaluation with
pelvic ultrasound recommended.

Other: None

Musculoskeletal: No acute or significant osseous findings.
IMPRESSION: 1. Complex multi cystic or multi-septated right ovarian lesion.
Further evaluation with pelvic ultrasound recommended.
2. Nonvisualization of the appendix. A small fluid collection in the
region of the adnexa adjacent to the posterior cecum is
indeterminate. Attention on pelvic ultrasound recommended.
3. No bowel obstruction.

## 2022-09-12 IMAGING — US US TRANSVAGINAL NON-OB
1 series · 13 of 25 positions shown · non-contrast
Comparison: CT evaluation which was acquired on December 23, 2021.

CLINICAL DATA: RIGHT ovarian cyst seen on CT.

EXAM:
TRANSABDOMINAL AND TRANSVAGINAL ULTRASOUND OF PELVIS
TECHNIQUE: Both transabdominal and transvaginal ultrasound examinations of the
pelvis were performed. Transabdominal technique was performed for
global imaging of the pelvis including uterus, ovaries, adnexal
regions, and pelvic cul-de-sac. It was necessary to proceed with
endovaginal exam following the transabdominal exam to visualize the
endometrium and RIGHT and LEFT adnexa.

[Series 1: us pelvis transvaginal non-ob (tv only) · 13 of 103 slices shown]
[im 1/103]
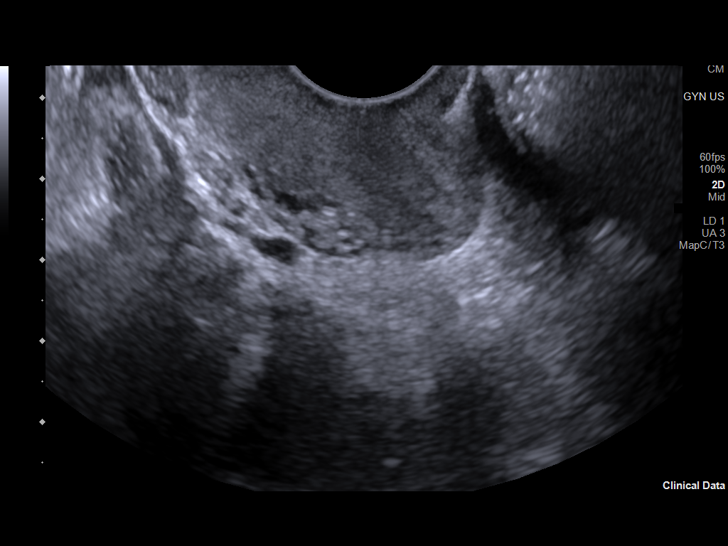
[im 9/103]
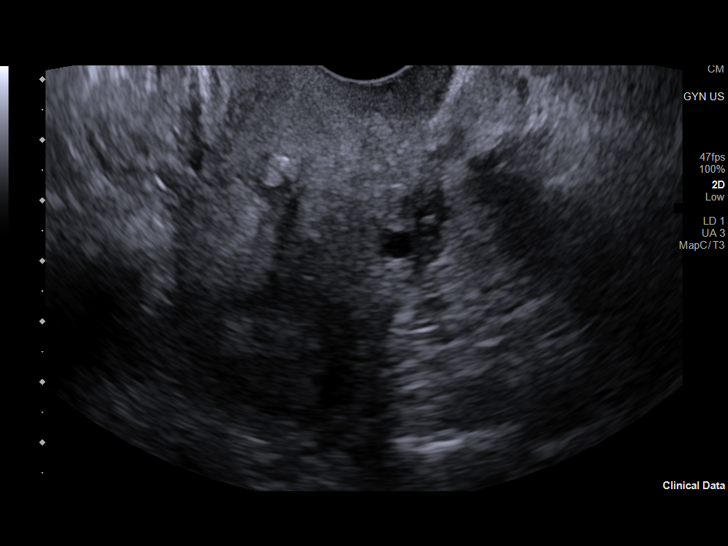
[im 18/103]
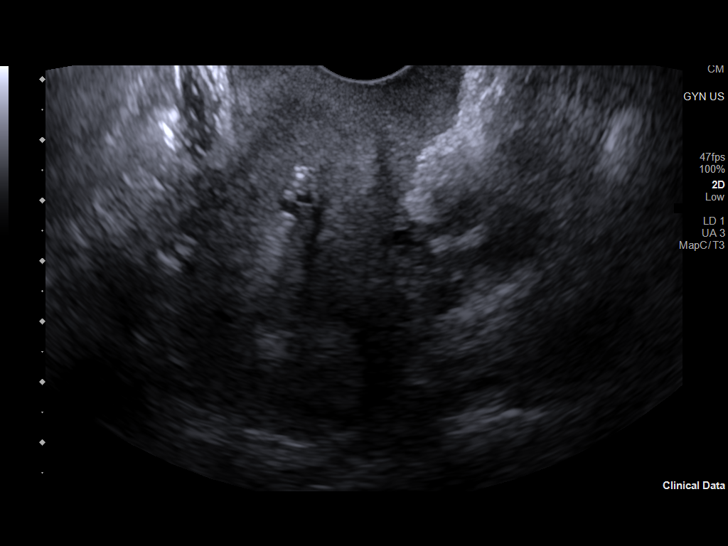
[im 26/103]
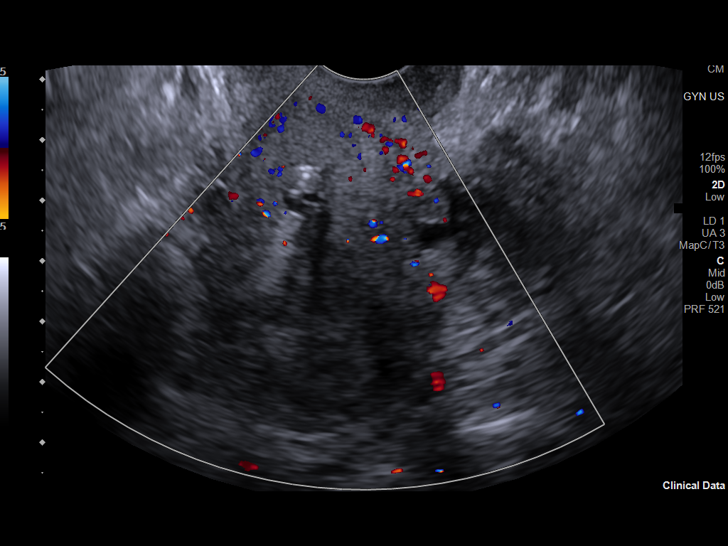
[im 35/103]
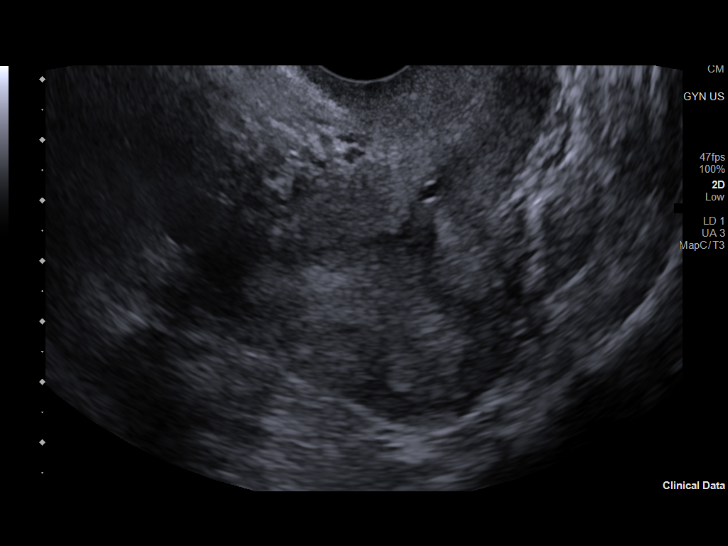
[im 43/103]
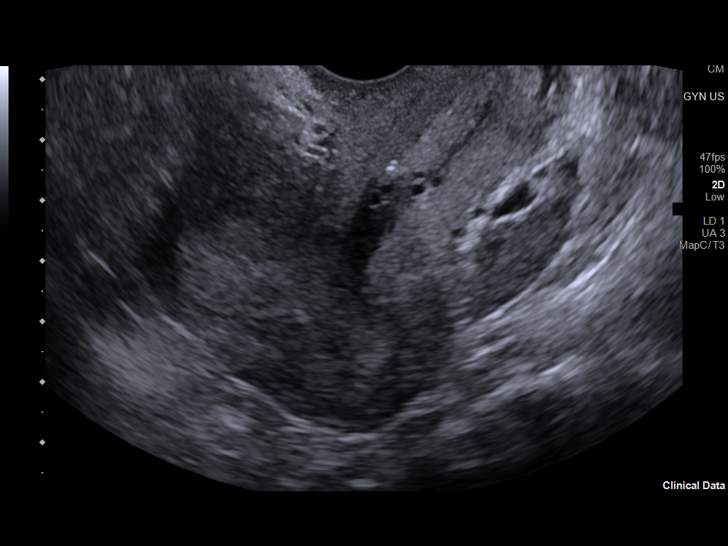
[im 52/103]
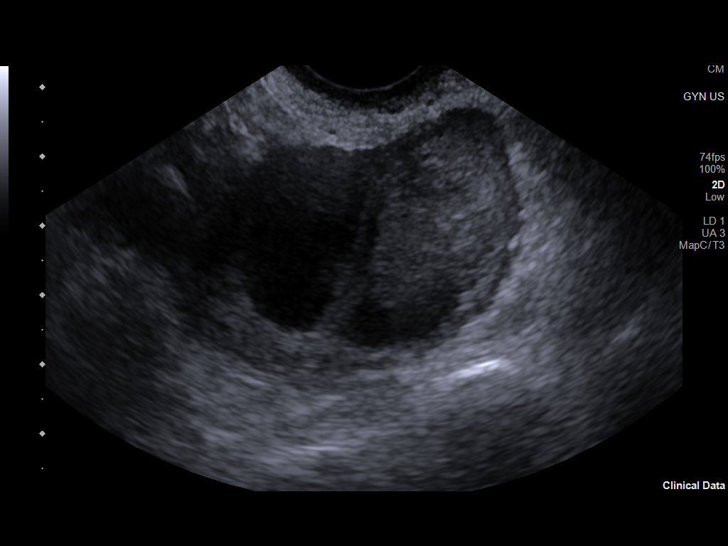
[im 60/103]
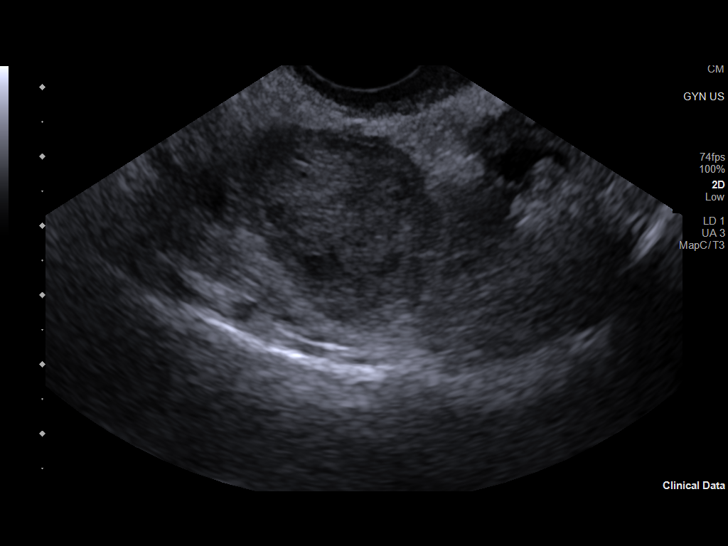
[im 69/103]
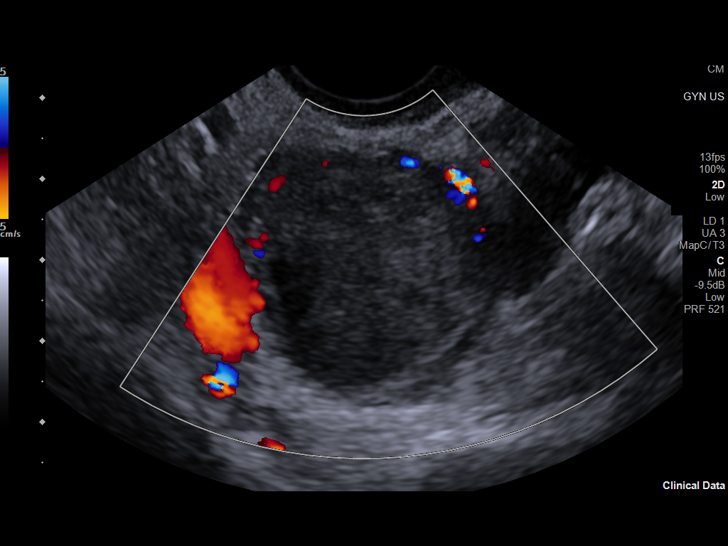
[im 77/103]
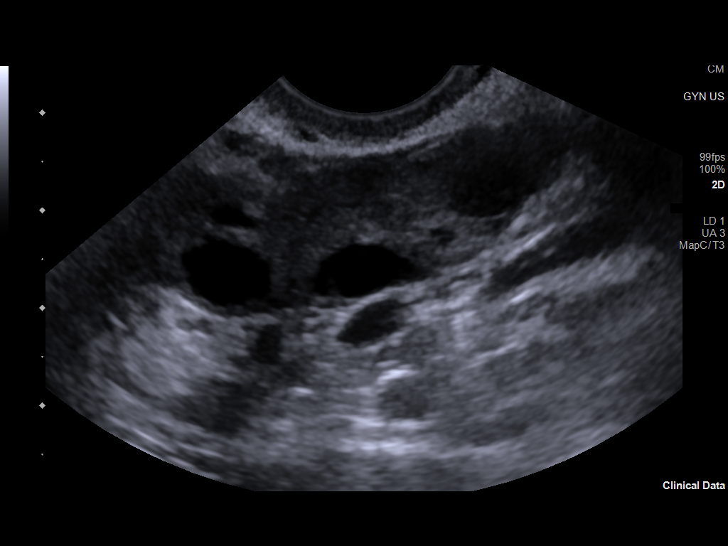
[im 86/103]
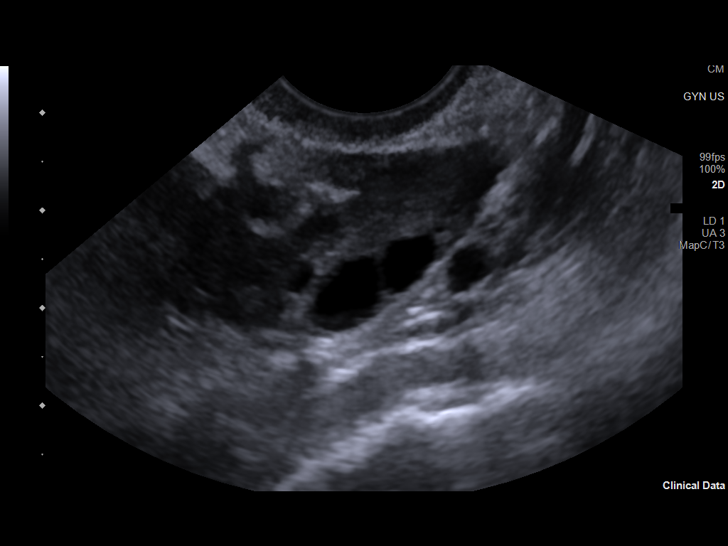
[im 94/103]
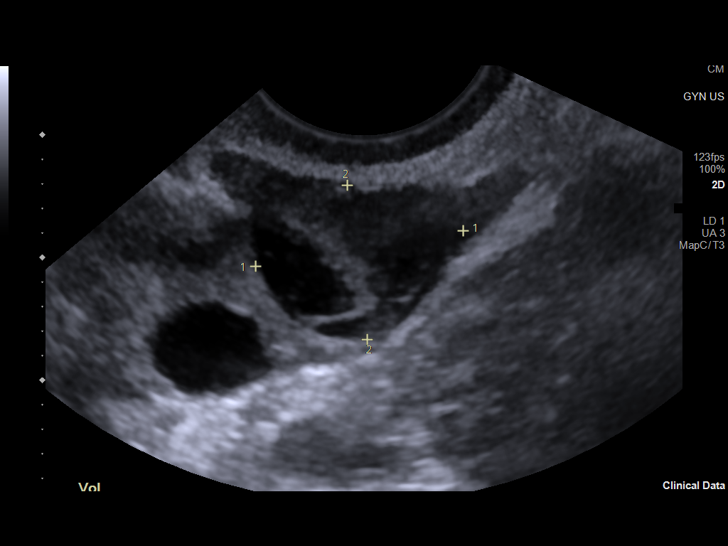
[im 103/103]
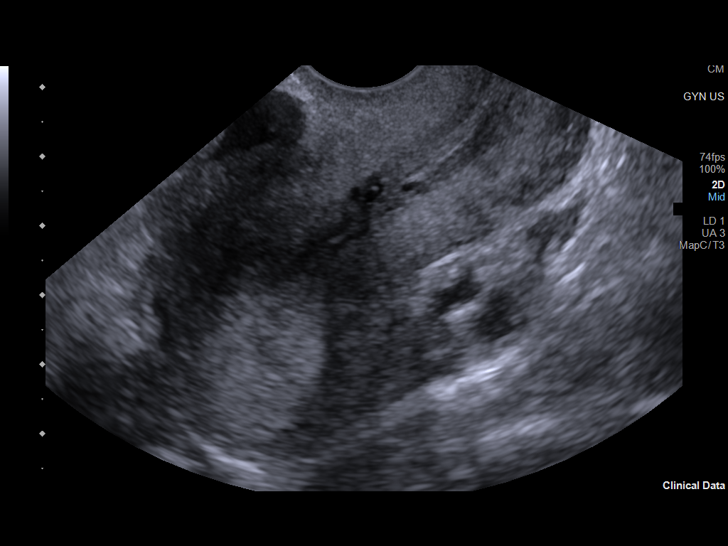

[13 of 25 positions shown; findings below may reference images not displayed]

FINDINGS: Uterus

Measurements: 5.9 x 3.9 x 5.2 cm = volume: 62 mL. Uterus displays in
arcuate type configuration. No focal uterine abnormality.

Endometrium

Thickness: 12.7 mm.  No focal abnormality visualized.

Right ovary

Measurements: 5.2 x 3.4 x 4.0 cm = volume: 36.9 mL. Multi cystic
area or single cysts with septation within the RIGHT ovary,
echogenic area next to a more hypoechoic or anechoic area both with
"cranial ated" appearance. The area that is more echogenic measuring
3.0 x 1.6 cm the adjacent area is slightly smaller measuring 2.4 x
1.5 cm in the sagittal plane with transverse dimension each area
proximally 3 and 2.3 cm.

Left ovary

Measurements: 4.1 x 1.5 x 2.8 cm = volume: 9.2 mL. Small hemorrhagic
cyst in the LEFT ovary measuring 1.7 x 1.3 x 1.7 cm

Other findings

Trace free fluid in the pelvis
IMPRESSION: Cystic changes in the RIGHT ovary favored to represent an involuting
hemorrhagic and follicular cyst of the RIGHT ovary adjacent to 1
another perhaps 1 representing hemorrhagic corpus luteum. Given that
this may not represent an incidental finding based on patient
history would consider short interval follow-up in 8-12 weeks to
ensure resolution or earlier based on patient's symptoms.

Second cystic area in the RIGHT adnexa referenced on previous
imaging is not evaluated.

Study not performed as a spectral Doppler assessment but there is
gross evidence of color Doppler flow. If there is concern for
torsion based on patient history could consider additional
evaluation as warranted.
